# Patient Record
Sex: Female | Born: 1980 | Race: White | Hispanic: No | Marital: Single | Smoking: Current every day smoker
Health system: Southern US, Community
[De-identification: ages and names within clinical notes are randomized; demographics above are authoritative.]

## PROBLEM LIST (undated history)

## (undated) DIAGNOSIS — M5136 Other intervertebral disc degeneration, lumbar region: Secondary | ICD-10-CM

## (undated) DIAGNOSIS — G43909 Migraine, unspecified, not intractable, without status migrainosus: Secondary | ICD-10-CM

## (undated) DIAGNOSIS — M51369 Other intervertebral disc degeneration, lumbar region without mention of lumbar back pain or lower extremity pain: Secondary | ICD-10-CM

## (undated) DIAGNOSIS — M419 Scoliosis, unspecified: Secondary | ICD-10-CM

## (undated) DIAGNOSIS — D72829 Elevated white blood cell count, unspecified: Secondary | ICD-10-CM

## (undated) DIAGNOSIS — T7840XA Allergy, unspecified, initial encounter: Secondary | ICD-10-CM

## (undated) HISTORY — DX: Elevated white blood cell count, unspecified: D72.829

## (undated) HISTORY — DX: Allergy, unspecified, initial encounter: T78.40XA

## (undated) HISTORY — PX: INTRAUTERINE DEVICE (IUD) INSERTION: SHX5877

## (undated) HISTORY — DX: Migraine, unspecified, not intractable, without status migrainosus: G43.909

## (undated) HISTORY — PX: CHOLECYSTECTOMY: SHX55

---

## 2012-01-17 ENCOUNTER — Telehealth: Payer: Self-pay | Admitting: Hematology & Oncology

## 2012-01-17 NOTE — Telephone Encounter (Signed)
Pt aware of 02-20-12

## 2012-02-20 ENCOUNTER — Other Ambulatory Visit: Payer: Self-pay | Admitting: Lab

## 2012-02-20 ENCOUNTER — Ambulatory Visit: Payer: Self-pay | Admitting: Hematology & Oncology

## 2012-02-20 ENCOUNTER — Telehealth: Payer: Self-pay | Admitting: Hematology & Oncology

## 2012-02-20 ENCOUNTER — Ambulatory Visit: Payer: Self-pay

## 2012-02-20 NOTE — Telephone Encounter (Signed)
Pt cx 10-8 rescheduled for 10-30 her son is sick

## 2012-03-13 ENCOUNTER — Ambulatory Visit (HOSPITAL_BASED_OUTPATIENT_CLINIC_OR_DEPARTMENT_OTHER): Payer: Medicaid Other | Admitting: Hematology & Oncology

## 2012-03-13 ENCOUNTER — Ambulatory Visit: Payer: Medicaid Other

## 2012-03-13 ENCOUNTER — Other Ambulatory Visit: Payer: Medicaid Other | Admitting: Lab

## 2012-03-13 VITALS — BP 132/81 | HR 71 | Temp 97.9°F | Resp 18 | Ht 62.0 in | Wt 167.0 lb

## 2012-03-13 DIAGNOSIS — D72829 Elevated white blood cell count, unspecified: Secondary | ICD-10-CM

## 2012-03-13 DIAGNOSIS — M549 Dorsalgia, unspecified: Secondary | ICD-10-CM

## 2012-03-13 DIAGNOSIS — G8929 Other chronic pain: Secondary | ICD-10-CM

## 2012-03-13 LAB — CBC WITH DIFFERENTIAL (CANCER CENTER ONLY)
BASO#: 0 10*3/uL (ref 0.0–0.2)
BASO%: 0.3 % (ref 0.0–2.0)
EOS%: 2.7 % (ref 0.0–7.0)
HCT: 42.5 % (ref 34.8–46.6)
HGB: 15.1 g/dL (ref 11.6–15.9)
LYMPH#: 5.3 10*3/uL — ABNORMAL HIGH (ref 0.9–3.3)
MCHC: 35.5 g/dL (ref 32.0–36.0)
MONO#: 0.7 10*3/uL (ref 0.1–0.9)
NEUT#: 6.1 10*3/uL (ref 1.5–6.5)
NEUT%: 49.1 % (ref 39.6–80.0)
RDW: 12.4 % (ref 11.1–15.7)
WBC: 12.5 10*3/uL — ABNORMAL HIGH (ref 3.9–10.0)

## 2012-03-13 NOTE — Progress Notes (Signed)
This office note has been dictated.

## 2012-03-14 NOTE — Progress Notes (Signed)
CC:   Dana Jubilee, MD  DIAGNOSIS:  Leukocytosis, likely reactive.  HISTORY OF PRESENT ILLNESS:  Dana Mcdonald is a very nice 31 year old white female.  She is followed by Dr. Alberteen Sam.  Dr. Alberteen Sam has noted that she has had some leukocytosis.  Dana Mcdonald suffers from low back issues.  She has had bulging disks. She has some sciatica down the left leg.  She has had epidural injections.  She has not had any for about a year. She does not want surgery.  She does smoke.  She smokes probably about a half-a-pack per day.  Dr. Alberteen Sam did some lab work on her.  She was found to have a mild leukocytosis.  Back in August a CBC was done, which showed a white cell count of 13.9, hemoglobin 16.5, hematocrit 46.7, platelet count was 334,000.  She has had a normal white cell differential.  MCV was normal. She had normal electrolytes.  Dana Mcdonald states that her sister does have some blood issues. According to Dana Mcdonald, her sister also has leukocytosis and thrombocytosis.  Dana Mcdonald has not had any problem with infections.  There have been no rashes.  She has had no change in bowel or bladder habits.  There have been no joint issues, outside of that with her back.  She has had 2 kids.  She C-sections for each child.  Her back issues began, I think, with her first pregnancy.  She has had some hair thinning.  There has been no dysphasia or odynophagia.  She has had no swollen lymph glands.  Dr. Alberteen Sam was very kind and referred her to the [facility] for an evaluation.  PAST MEDICAL HISTORY: 1. 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 2. Cholecystectomy. 3. Migraines.  ALLERGIES:  None.  MEDICATIONS: 1. Norco (10/325) 1 p.o. q.6 hours p.r.n. 2. Vitamin D 1000 units p.o. daily.  SOCIAL HISTORY:  Remarkable for tobacco use.  She has about a 10-pack- year history of tobacco use.  There is occasional alcohol use.  There are no occupational  exposures.  FAMILY HISTORY:  Remarkable for, I think, ovarian cancer, diabetes, CVA.  REVIEW OF SYSTEMS:  As stated in History of Present Illness.  No additional findings noted on a 12-system review.  PHYSICAL EXAMINATION:  General:  This is a well-developed, well- nourished white female, in no obvious distress.  Vital signs: Temperature of 97.9, pulse 71, respiratory rate 18, blood pressure 132/81.  Weight is 167.  Head and neck:  Normocephalic, atraumatic skull.  There are no ocular or oral lesions.  There are no palpable cervical or supraclavicular lymph nodes.  She has no palpable thyroid. Lungs:  Clear bilaterally.  Cardiac:  Regular rate and rhythm, with a normal S1, S2.  There are no murmurs, rubs or bruits.  Abdomen:  Soft, with good bowel sounds.  There is no palpable abdominal mass.  There is no palpable hepatosplenomegaly.  Back:  No tenderness over the spine, ribs, or hips.  There may be some slight muscle spasms noted in the lumbosacral spine bilaterally.  Extremities:  Show no clubbing, cyanosis, or edema.  She has good range of motion of her joints.  Skin: Shows no rashes, ecchymoses, or petechiae.  Neurological:  Shows no focal neurological deficits.  LABORATORY STUDIES:  White cell count is 12.5, hemoglobin 15.1, hematocrit 42.5, platelet count 288,000.  MCV is 95.  White cell differential shows 50 segs, 43 lymphs, 5 monos, 3 eosinophils.  Peripheral smear shows  a normochromic, normocytic population of red blood cells.  There are no nucleated red blood cells.  I see no teardrop cells.  There is no rouleaux formation.  There are no target cells.  I see no schistocytes or spherocytes.  White cells appear minimally increased in number.  There are no hypersegmented polys.  I see no immature myeloid cells.  There are no atypical lymphocytes.  There are no blasts.  Platelets are adequate in number and size.  IMPRESSION:  Dana Mcdonald is a very nice 31 year old white  female.  She has mild leukocytosis.  I have to believe that this is reactive.  I did send off a leukocyte alkaline phosphatase on her blood work.  I do not see any evidence of a myeloproliferative neoplasm.  As such, I do not see that we needed to send off a BCR/ABL or JAK2 assay.  I do not see a need for a bone marrow biopsy on Dana Mcdonald.  I do not find anything on her physical exam that would suggest an underlying neoplasm or malignancy.  I do not think she needs a chest x-ray.  She has no pulmonary symptoms. She does smoke.  I suppose that smoking might be related to her leukocytosis.  However, typically leukocytosis related to smoking shows slight lymphocytosis.  I suspect that the chronic back issues that she has might be also related to the elevated white cells.  Again, she is under constant stress.  The constant inflammation from her back could certainly be triggering her immune system a little bit.  From my point of view, I think we can just follow her up in about 4 months' time.  I do not think we need any blood work in between visits.  I spent a good hour with Dana Mcdonald.  She is very nice.  I showed her the lab work.  I reviewed my recommendations for her.  She has good understanding.  She, herself, is not too worried, although with a history of cancer in the family and with her sister having some blood problems, she was reassured after seeing [place].  I did give her a prayer blanket.  She was very appreciative of this.    ______________________________ Josph Macho, M.D. PRE/MEDQ  D:  03/13/2012  T:  03/14/2012  Job:  [id number]

## 2012-03-22 LAB — LEUKOCYTE ALKALINE PHOSPHATASE: Leukocyte Alkaline Phos Stain: 190

## 2012-03-22 LAB — LACTATE DEHYDROGENASE: LDH: 193 U/L (ref 94–250)

## 2012-07-02 ENCOUNTER — Telehealth: Payer: Self-pay | Admitting: Hematology & Oncology

## 2012-07-02 NOTE — Telephone Encounter (Signed)
Informed patient of appointment change to 08/09/12 at 10:30am.  Patient confirmed this time will be ok.

## 2012-07-18 ENCOUNTER — Other Ambulatory Visit: Payer: Medicaid Other | Admitting: Lab

## 2012-07-18 ENCOUNTER — Ambulatory Visit: Payer: Medicaid Other | Admitting: Hematology & Oncology

## 2012-08-09 ENCOUNTER — Ambulatory Visit: Payer: Medicaid Other | Admitting: Hematology & Oncology

## 2012-08-09 ENCOUNTER — Other Ambulatory Visit: Payer: Medicaid Other | Admitting: Lab

## 2012-08-09 ENCOUNTER — Ambulatory Visit (HOSPITAL_BASED_OUTPATIENT_CLINIC_OR_DEPARTMENT_OTHER): Payer: Medicaid Other | Admitting: Lab

## 2012-08-09 ENCOUNTER — Ambulatory Visit (HOSPITAL_BASED_OUTPATIENT_CLINIC_OR_DEPARTMENT_OTHER): Payer: Medicaid Other | Admitting: Medical

## 2012-08-09 VITALS — BP 126/80 | HR 76 | Temp 98.0°F | Resp 18 | Ht 62.0 in | Wt 167.0 lb

## 2012-08-09 DIAGNOSIS — D72829 Elevated white blood cell count, unspecified: Secondary | ICD-10-CM

## 2012-08-09 LAB — CBC WITH DIFFERENTIAL (CANCER CENTER ONLY)
EOS%: 1.5 % (ref 0.0–7.0)
MCH: 32.7 pg (ref 26.0–34.0)
MCHC: 34.8 g/dL (ref 32.0–36.0)
MONO%: 5 % (ref 0.0–13.0)
NEUT#: 9.2 10*3/uL — ABNORMAL HIGH (ref 1.5–6.5)
Platelets: 305 10*3/uL (ref 145–400)
RBC: 4.71 10*6/uL (ref 3.70–5.32)

## 2012-08-09 LAB — CHCC SATELLITE - SMEAR

## 2012-08-09 NOTE — Progress Notes (Signed)
DIAGNOSIS:  Leukocytosis, likely reactive.  Current therapy: Observation  Interim history: Dana Mcdonald presents today for an office followup visit.  Overall, she, reports, that she's doing quite well.  We continue to check.  Her white count secondary to her leukocytosis.  Her white count today is 14.  She does continue to smoke.  She also reports, that she gets epidural steroid injections intermittently.  She's not reported any recent, chronic or recurrent infections.  She's not reporting any rashes.  She does have major problems with her lower back.  She's not reporting any fevers, or night sweats, or unintentional weight, loss.  She's not reporting any swollen glands.  Her leukocytosis could be reactive.  She, reports, a good appetite.  She denies any nausea, vomiting, diarrhea, constipation, chest pain, shortness of breath, or cough.  She denies any fevers, chills, or night sweats.  She denies any type of abdominal pain, any obvious, or abnormal bleeding.  She denies any lower leg swelling.  She denies any, visual changes, or rashes.  She does have intermittent headaches.  Overall, she remains asymptomatic.   Review of Systems: Constitutional:Negative for malaise/fatigue, fever, chills, weight loss, diaphoresis, activity change, appetite change, and unexpected weight change.  HEENT: Negative for double vision, blurred vision, visual loss, ear pain, tinnitus, congestion, rhinorrhea, epistaxis sore throat or sinus disease, oral pain/lesion, tongue soreness Respiratory: Negative for cough, chest tightness, shortness of breath, wheezing and stridor.  Cardiovascular: Negative for chest pain, palpitations, leg swelling, orthopnea, PND, DOE or claudication Gastrointestinal: Negative for nausea, vomiting, abdominal pain, diarrhea, constipation, blood in stool, melena, hematochezia, abdominal distention, anal bleeding, rectal pain, anorexia and hematemesis.  Genitourinary: Negative for dysuria, frequency,  hematuria,  Musculoskeletal: Negative for myalgias, back pain, joint swelling, arthralgias and gait problem.  Skin: Negative for rash, color change, pallor and wound.  Neurological:. Negative for dizziness/light-headedness, tremors, seizures, syncope, facial asymmetry, speech difficulty, weakness, numbness, headaches and paresthesias.  Hematological: Negative for adenopathy. Does not bruise/bleed easily.  Psychiatric/Behavioral:  Negative for depression, no loss of interest in normal activity or change in sleep pattern.   Physical Exam: This is a pleasant, 32 year old, well-developed, well-nourished, white female, in no obvious distress Vitals:, temperature 98.0 degrees, pulse 76, respirations 18, blood pressure 126/80.  Weight 167 pounds  HEENT reveals a normocephalic, atraumatic skull, no scleral icterus, no oral lesions  Neck is supple without any cervical or supraclavicular adenopathy.  Lungs are clear to auscultation bilaterally. There are no wheezes, rales or rhonci Cardiac is regular rate and rhythm with a normal S1 and S2. There are no murmurs, rubs, or bruits.  Abdomen is soft with good bowel sounds, there is no palpable mass. There is no palpable hepatosplenomegaly. There is no palpable fluid wave.  Musculoskeletal no tenderness of the spine, ribs, or hips.  Extremities there are no clubbing, cyanosis, or edema.  Skin no petechia, purpura or ecchymosis Neurologic is nonfocal.  Laboratory Data: White count 14.0, hemoglobin 15.4, hematocrit 44.2, platelets 305,000  Current Outpatient Prescriptions on File Prior to Visit  Medication Sig Dispense Refill  . HYDROcodone-acetaminophen (NORCO) 10-325 MG per tablet Take 1 tablet by mouth every 6 (six) hours as needed.      . Multiple Vitamins-Minerals (WOMENS 50+ MULTI VITAMIN/MIN) TABS Take by mouth every morning.      . Vit B6-Vit B12-Omega 3 Acids (VITAMIN B PLUS+ PO) Take by mouth every morning.      Marland Kitchen VITAMIN D, CHOLECALCIFEROL, PO  Take 1,000 Units by mouth every morning.  No current facility-administered medications on file prior to visit.   Assessment/Plan: This is a 32 year old white female with the following issues:  #1.  Mild leukocytosis.  This is most likely reactive..  There is no indication for any type of bone marrow, biopsy, at this time.  She does smoke.  She also has chronic back issues, which produce, inflammation.  We will continue to follow her about every 4 months.  #2.  Followup.  We will follow back up with Dana Mcdonald in 4 months, but before then should there be questions or concerns.

## 2012-08-29 ENCOUNTER — Ambulatory Visit (INDEPENDENT_AMBULATORY_CARE_PROVIDER_SITE_OTHER): Payer: Medicaid Other | Admitting: Family Medicine

## 2012-08-29 ENCOUNTER — Encounter: Payer: Self-pay | Admitting: Family Medicine

## 2012-08-29 VITALS — BP 124/88 | HR 83 | Wt 166.0 lb

## 2012-08-29 DIAGNOSIS — J069 Acute upper respiratory infection, unspecified: Secondary | ICD-10-CM

## 2012-08-29 MED ORDER — AMOXICILLIN 875 MG PO TABS: 875.0000 mg | ORAL_TABLET | Freq: Two times a day (BID) | ORAL | Status: DC

## 2012-08-29 NOTE — Patient Instructions (Addendum)
1)  URI   A)  COLD EEZE & UMCKA COLD CARE  B)  Head Congestion - Mucinex D 1200/120 -  2 times per day, Zyrtec and Steroid Nasal Spray (Nasonex vs Flonase)   C)  Cough - Delysm 2 teaspoons 2 times per day and Tessalon Perles   D) Take Amoxil 2 times per day for 10 days    Allergies, Generic Allergies may happen from anything your body is sensitive to. This may be food, medicines, pollens, chemicals, and nearly anything around you in everyday life that produces allergens. An allergen is anything that causes an allergy producing substance. Heredity is often a factor in causing these problems. This means you may have some of the same allergies as your parents. Food allergies happen in all age groups. Food allergies are some of the most severe and life threatening. Some common food allergies are cow's milk, seafood, eggs, nuts, wheat, and soybeans. SYMPTOMS   Swelling around the mouth.  An itchy red rash or hives.  Vomiting or diarrhea.  Difficulty breathing. SEVERE ALLERGIC REACTIONS ARE LIFE-THREATENING. This reaction is called anaphylaxis. It can cause the mouth and throat to swell and cause difficulty with breathing and swallowing. In severe reactions only a trace amount of food (for example, peanut oil in a salad) may cause death within seconds. Seasonal allergies occur in all age groups. These are seasonal because they usually occur during the same season every year. They may be a reaction to molds, grass pollens, or tree pollens. Other causes of problems are house dust mite allergens, pet dander, and mold spores. The symptoms often consist of nasal congestion, a runny itchy nose associated with sneezing, and tearing itchy eyes. There is often an associated itching of the mouth and ears. The problems happen when you come in contact with pollens and other allergens. Allergens are the particles in the air that the body reacts to with an allergic reaction. This causes you to release allergic  antibodies. Through a chain of events, these eventually cause you to release histamine into the blood stream. Although it is meant to be protective to the body, it is this release that causes your discomfort. This is why you were given anti-histamines to feel better. If you are unable to pinpoint the offending allergen, it may be determined by skin or blood testing. Allergies cannot be cured but can be controlled with medicine. Hay fever is a collection of all or some of the seasonal allergy problems. It may often be treated with simple over-the-counter medicine such as diphenhydramine. Take medicine as directed. Do not drink alcohol or drive while taking this medicine. Check with your caregiver or package insert for child dosages. If these medicines are not effective, there are many new medicines your caregiver can prescribe. Stronger medicine such as nasal spray, eye drops, and corticosteroids may be used if the first things you try do not work well. Other treatments such as immunotherapy or desensitizing injections can be used if all else fails. Follow up with your caregiver if problems continue. These seasonal allergies are usually not life threatening. They are generally more of a nuisance that can often be handled using medicine. HOME CARE INSTRUCTIONS   If unsure what causes a reaction, keep a diary of foods eaten and symptoms that follow. Avoid foods that cause reactions.  If hives or rash are present:  Take medicine as directed.  You may use an over-the-counter antihistamine (diphenhydramine) for hives and itching as needed.  Apply cold  compresses (cloths) to the skin or take baths in cool water. Avoid hot baths or showers. Heat will make a rash and itching worse.  If you are severely allergic:  Following a treatment for a severe reaction, hospitalization is often required for closer follow-up.  Wear a medic-alert bracelet or necklace stating the allergy.  You and your family must  learn how to give adrenaline or use an anaphylaxis kit.  If you have had a severe reaction, always carry your anaphylaxis kit or EpiPen with you. Use this medicine as directed by your caregiver if a severe reaction is occurring. Failure to do so could have a fatal outcome. SEEK MEDICAL CARE IF:  You suspect a food allergy. Symptoms generally happen within 30 minutes of eating a food.  Your symptoms have not gone away within 2 days or are getting worse.  You develop new symptoms.  You want to retest yourself or your child with a food or drink you think causes an allergic reaction. Never do this if an anaphylactic reaction to that food or drink has happened before. Only do this under the care of a caregiver. SEEK IMMEDIATE MEDICAL CARE IF:   You have difficulty breathing, are wheezing, or have a tight feeling in your chest or throat.  You have a swollen mouth, or you have hives, swelling, or itching all over your body.  You have had a severe reaction that has responded to your anaphylaxis kit or an EpiPen. These reactions may return when the medicine has worn off. These reactions should be considered life threatening. MAKE SURE YOU:   Understand these instructions.  Will watch your condition.  Will get help right away if you are not doing well or get worse. Document Released: 07/25/2002 Document Revised: 07/24/2011 Document Reviewed: 12/30/2007 [facility] Patient Information 2013 [place], [place].

## 2012-08-29 NOTE — Progress Notes (Signed)
Subjective:     Patient ID: Dana Mcdonald, female   DOB: 1981-02-18, 32 y.o.   MRN: [id number]  HPI Dana Mcdonald is here today complaining of URI symptoms including facial pressure and right ear pain.  Dana Mcdonald has been feeling ill for the past several days.  Dana Mcdonald describes her symptoms as being moderate in nature and Dana Mcdonald feels that Dana Mcdonald is getting worse.  Dana Mcdonald is amazed at the amount of congestion Dana Mcdonald is blowing out of her head.  Dana Mcdonald has a Lloyd Huger Med Sinus rinse at home but has not used it.  Dana Mcdonald has been taking Claritin which has not helped her very much.    Review of Systems  Constitutional: Negative for fever and chills.  HENT: Positive for ear pain, congestion, dental problem, postnasal drip and sinus pressure. Negative for hearing loss.   Eyes: Positive for redness.  Respiratory: Positive for cough.       Past Medical History  Diagnosis Date  . Migraine   . Allergy     Family History  Problem Relation Age of Onset  . Heart disease Father   . Diabetes Father   . Diabetes Sister   . Hypertension Sister   . Heart disease Maternal Grandfather    History   Social History  . Marital Status: Unknown    Spouse Name: N/A    Number of Children: N/A  . Years of Education: N/A   Occupational History  . Not on file.   Social History Main Topics  . Smoking status: Current Every Day Smoker -- 0.50 packs/day    Start date: 05/16/1999  . Smokeless tobacco: Not on file  . Alcohol Use: Not on file  . Drug Use: Not on file  . Sexually Active: Yes   Other Topics Concern  . Not on file   Social History Narrative   Marital Status: Single ([place])    Children:  Sons [name], [place])    Pets: None   Living Situation: Lives alone with her mother   Occupation: Unemployed   Education:  Engineer, agricultural    Tobacco Use/Exposure:  Yes - Dana Mcdonald is smoking 5-6 cigs per day.  Dana Mcdonald has smoked about 10 years.     Alcohol Use:  Occasional wine, beer   Drug Use:  None   Diet:  Regular   Exercise:   Walking, Softball, Volleyball    Hobbies:  Reading    Objective:   Physical Exam  Constitutional: Dana Mcdonald appears well-nourished. No distress.  HENT:  Head: Normocephalic.  Right Ear: There is tenderness. Tympanic membrane is injected.  Left Ear: Hearing normal. A middle ear effusion is present.  Eyes: Right eye exhibits no discharge. Left eye exhibits no discharge. Right conjunctiva is injected. Left conjunctiva is injected.  Neck: Neck supple. No thyromegaly present.  Cardiovascular: Normal rate, regular rhythm and normal heart sounds.   Pulmonary/Chest: Effort normal and breath sounds normal.  Lymphadenopathy:    Dana Mcdonald has no cervical adenopathy.  Neurological: Dana Mcdonald is alert.  Skin: Skin is warm and dry.  Psychiatric: Dana Mcdonald has a normal mood and affect.       Assessment:     Upper Respiratory Infection Otalgia     Plan:    1)  Dana Mcdonald was reminded to ALWAYS start her treatment with Cold Eeze and Umcka Cold Care to minimize the severity and duration of her symptoms.    2)  Dana Mcdonald will take Mucinex D (1200/120) and Zyrtec for her head congestion and was instructed how to  use a Nasonex Nasal Spray. Dana Mcdonald was provided with a sample of Nasonex but was given a prescription for fluticasone since this is probably all that Medicaid will cover. Dana Mcdonald will take Delsym and Tessalon Perles for her cough.    3)  Since her right ear is so inflamed and Dana Mcdonald is in a lot of pain, I decided to treat her with Amoxil.  Hopefully, this will keep her ear looking like it did the last time Dana Mcdonald had an ear infection back in 08/2010.

## 2012-08-30 ENCOUNTER — Encounter: Payer: Self-pay | Admitting: Family Medicine

## 2012-08-31 ENCOUNTER — Encounter: Payer: Self-pay | Admitting: Family Medicine

## 2012-08-31 DIAGNOSIS — J069 Acute upper respiratory infection, unspecified: Secondary | ICD-10-CM | POA: Insufficient documentation

## 2012-09-16 ENCOUNTER — Telehealth: Payer: Self-pay | Admitting: *Deleted

## 2012-09-16 NOTE — Telephone Encounter (Signed)
[facility] CALLED AND SAID THAT THEY NEED A REFERRAL TO SEE PT.  PT IS HAVING ISSUES WITH IUD AND WANTS A YEARLY PAP/PHSY.  DR. Weyman Croon BO PLEASE CALL JAMIE (RECEPT AT [facility]) [phone]

## 2012-12-05 ENCOUNTER — Telehealth: Payer: Self-pay | Admitting: Hematology & Oncology

## 2012-12-05 NOTE — Telephone Encounter (Signed)
Pt moved 7-25 to 8-5 her son is sick

## 2012-12-06 ENCOUNTER — Other Ambulatory Visit: Payer: Medicaid Other | Admitting: Lab

## 2012-12-06 ENCOUNTER — Ambulatory Visit: Payer: Medicaid Other | Admitting: Hematology & Oncology

## 2012-12-17 ENCOUNTER — Ambulatory Visit: Payer: Medicaid Other | Admitting: Hematology & Oncology

## 2012-12-17 ENCOUNTER — Other Ambulatory Visit: Payer: Medicaid Other | Admitting: Lab

## 2012-12-17 ENCOUNTER — Telehealth: Payer: Self-pay | Admitting: Hematology & Oncology

## 2012-12-17 NOTE — Telephone Encounter (Signed)
Pt called said she is sick and will call back to reschedule

## 2013-04-16 ENCOUNTER — Encounter: Payer: Self-pay | Admitting: Family Medicine

## 2013-04-16 ENCOUNTER — Ambulatory Visit (INDEPENDENT_AMBULATORY_CARE_PROVIDER_SITE_OTHER): Payer: Medicaid Other | Admitting: Family Medicine

## 2013-04-16 VITALS — BP 122/85 | HR 71 | Resp 16 | Ht 62.5 in | Wt 158.0 lb

## 2013-04-16 DIAGNOSIS — F411 Generalized anxiety disorder: Secondary | ICD-10-CM

## 2013-04-16 DIAGNOSIS — F329 Major depressive disorder, single episode, unspecified: Secondary | ICD-10-CM

## 2013-04-16 DIAGNOSIS — F3289 Other specified depressive episodes: Secondary | ICD-10-CM

## 2013-04-16 DIAGNOSIS — Z720 Tobacco use: Secondary | ICD-10-CM | POA: Insufficient documentation

## 2013-04-16 DIAGNOSIS — F17200 Nicotine dependence, unspecified, uncomplicated: Secondary | ICD-10-CM

## 2013-04-16 MED ORDER — CITALOPRAM HYDROBROMIDE 20 MG PO TABS: 20.0000 mg | ORAL_TABLET | Freq: Every day | ORAL | Status: DC

## 2013-04-16 MED ORDER — BUPROPION HCL ER (XL) 150 MG PO TB24: 150.0000 mg | ORAL_TABLET | ORAL | Status: DC

## 2013-04-16 NOTE — Patient Instructions (Signed)
1)  Mood - Start with 1/2 citalopram at bedtime for 1 week then increase to 1 tab and add the Wellbutrin 150 in the am.     Smoking Cessation Quitting smoking is important to your health and has many advantages. However, it is not always easy to quit since nicotine is a very addictive drug. Often times, people try 3 times or more before being able to quit. This document explains the best ways for you to prepare to quit smoking. Quitting takes hard work and a lot of effort, but you can do it. ADVANTAGES OF QUITTING SMOKING  You will live longer, feel better, and live better.  Your body will feel the impact of quitting smoking almost immediately.  Within 20 minutes, blood pressure decreases. Your pulse returns to its normal level.  After 8 hours, carbon monoxide levels in the blood return to normal. Your oxygen level increases.  After 24 hours, the chance of having a heart attack starts to decrease. Your breath, hair, and body stop smelling like smoke.  After 48 hours, damaged nerve endings begin to recover. Your sense of taste and smell improve.  After 72 hours, the body is virtually free of nicotine. Your bronchial tubes relax and breathing becomes easier.  After 2 to 12 weeks, lungs can hold more air. Exercise becomes easier and circulation improves.  The risk of having a heart attack, stroke, cancer, or lung disease is greatly reduced.  After 1 year, the risk of coronary heart disease is cut in half.  After 5 years, the risk of stroke falls to the same as a nonsmoker.  After 10 years, the risk of lung cancer is cut in half and the risk of other cancers decreases significantly.  After 15 years, the risk of coronary heart disease drops, usually to the level of a nonsmoker.  If you are pregnant, quitting smoking will improve your chances of having a healthy baby.  The people you live with, especially any children, will be healthier.  You will have extra money to spend on things  other than cigarettes. QUESTIONS TO THINK ABOUT BEFORE ATTEMPTING TO QUIT You may want to talk about your answers with your caregiver.  Why do you want to quit?  If you tried to quit in the past, what helped and what did not?  What will be the most difficult situations for you after you quit? How will you plan to handle them?  Who can help you through the tough times? Your family? Friends? A caregiver?  What pleasures do you get from smoking? What ways can you still get pleasure if you quit? Here are some questions to ask your caregiver:  How can you help me to be successful at quitting?  What medicine do you think would be best for me and how should I take it?  What should I do if I need more help?  What is smoking withdrawal like? How can I get information on withdrawal? GET READY  Set a quit date.  Change your environment by getting rid of all cigarettes, ashtrays, matches, and lighters in your home, car, or work. Do not let people smoke in your home.  Review your past attempts to quit. Think about what worked and what did not. GET SUPPORT AND ENCOURAGEMENT You have a better chance of being successful if you have help. You can get support in many ways.  Tell your family, friends, and co-workers that you are going to quit and need their support. Ask  them not to smoke around you.  Get individual, group, or telephone counseling and support. Programs are available at [employer] and health centers. Call your local health department for information about programs in your area.  Spiritual beliefs and practices may help some smokers quit.  Download a "quit meter" on your computer to keep track of quit statistics, such as how long you have gone without smoking, cigarettes not smoked, and money saved.  Get a self-help book about quitting smoking and staying off of tobacco. LEARN NEW SKILLS AND BEHAVIORS  Distract yourself from urges to smoke. Talk to someone, go for a walk,  or occupy your time with a task.  Change your normal routine. Take a different route to work. Drink tea instead of coffee. Eat breakfast in a different place.  Reduce your stress. Take a hot bath, exercise, or read a book.  Plan something enjoyable to do every day. Reward yourself for not smoking.  Explore interactive web-based programs that specialize in helping you quit. GET MEDICINE AND USE IT CORRECTLY Medicines can help you stop smoking and decrease the urge to smoke. Combining medicine with the above behavioral methods and support can greatly increase your chances of successfully quitting smoking.  Nicotine replacement therapy helps deliver nicotine to your body without the negative effects and risks of smoking. Nicotine replacement therapy includes nicotine gum, lozenges, inhalers, nasal sprays, and skin patches. Some may be available over-the-counter and others require a prescription.  Antidepressant medicine helps people abstain from smoking, but how this works is unknown. This medicine is available by prescription.  Nicotinic receptor partial agonist medicine simulates the effect of nicotine in your brain. This medicine is available by prescription. Ask your caregiver for advice about which medicines to use and how to use them based on your health history. Your caregiver will tell you what side effects to look out for if you choose to be on a medicine or therapy. Carefully read the information on the package. Do not use any other product containing nicotine while using a nicotine replacement product.  RELAPSE OR DIFFICULT SITUATIONS Most relapses occur within the first 3 months after quitting. Do not be discouraged if you start smoking again. Remember, most people try several times before finally quitting. You may have symptoms of withdrawal because your body is used to nicotine. You may crave cigarettes, be irritable, feel very hungry, cough often, get headaches, or have difficulty  concentrating. The withdrawal symptoms are only temporary. They are strongest when you first quit, but they will go away within 10 14 days. To reduce the chances of relapse, try to:  Avoid drinking alcohol. Drinking lowers your chances of successfully quitting.  Reduce the amount of caffeine you consume. Once you quit smoking, the amount of caffeine in your body increases and can give you symptoms, such as a rapid heartbeat, sweating, and anxiety.  Avoid smokers because they can make you want to smoke.  Do not let weight gain distract you. Many smokers will gain weight when they quit, usually less than 10 pounds. Eat a healthy diet and stay active. You can always lose the weight gained after you quit.  Find ways to improve your mood other than smoking. FOR MORE INFORMATION  www.smokefree.gov  Document Released: 04/25/2001 Document Revised: 10/31/2011 Document Reviewed: 08/10/2011 [facility] Patient Information 2014 [place], [place].

## 2013-04-16 NOTE — Progress Notes (Signed)
Subjective:    Patient ID: Dana Mcdonald, female    DOB: 12/15/1980, 32 y.o.   MRN: [id number]  HPI  Dana Mcdonald is here today to discuss her mood which she feels is unstable.  She is currently trying to quit smoking by reducing the number of cigarettes she smokes daily.  She continues to crave nicotine and says that she feels anxious, sad and irritable at times.  She wonders if this is a side effect of nicotine withdrawal.     Review of Systems  Constitutional: Negative for fatigue and unexpected weight change.  Respiratory: Negative for chest tightness and shortness of breath.   Cardiovascular: Negative for chest pain and palpitations.  Psychiatric/Behavioral: Positive for sleep disturbance. The patient is nervous/anxious.        Sad and irritable  All other systems reviewed and are negative.     Past Medical History  Diagnosis Date  . Migraine   . Allergy   . Leukocytosis      Past Surgical History  Procedure Laterality Date  . Cholecystectomy    . Intrauterine device (iud) insertion       History   Social History Narrative   Marital Status: Single (Dana Mcdonald)    Children:  Sons Dana Mcdonald, Dana Mcdonald)    Pets: None   Living Situation: Lives alone with her mother   Occupation: Unemployed   Education:  Engineer, agricultural    Tobacco Use/Exposure:  Yes - She is smoking 5-6 cigs per day.  She has smoked about 10 years.     Alcohol Use:  Occasional wine, beer   Drug Use:  None   Diet:  Regular   Exercise:  Walking, Softball, Volleyball    Hobbies:  Reading     Family History  Problem Relation Age of Onset  . Heart disease Father   . Diabetes Father   . Diabetes Sister   . Hypertension Sister   . Heart disease Sister   . Heart disease Maternal Grandfather      Current Outpatient Prescriptions on File Prior to Visit  Medication Sig Dispense Refill  . EPINEPHrine (EPIPEN JR) 0.15 MG/0.3ML injection Inject 0.15 mg into the muscle as needed for anaphylaxis.       Marland Kitchen HYDROcodone-acetaminophen (NORCO) 10-325 MG per tablet Take 1 tablet by mouth every 6 (six) hours as needed.      . Vit B6-Vit B12-Omega 3 Acids (VITAMIN B PLUS+ PO) Take by mouth every morning.      . Multiple Vitamins-Minerals (WOMENS 50+ MULTI VITAMIN/MIN) TABS Take by mouth every morning.       No current facility-administered medications on file prior to visit.     Allergies  Allergen Reactions  . Bee Venom Hives      Objective:   Physical Exam  Vitals reviewed. Constitutional: She is oriented to person, place, and time.  Eyes: Conjunctivae are normal. No scleral icterus.  Neck: Neck supple. No thyromegaly present.  Cardiovascular: Normal rate, regular rhythm and normal heart sounds.   Pulmonary/Chest: Effort normal and breath sounds normal.  Musculoskeletal: She exhibits no edema and no tenderness.  Lymphadenopathy:    She has no cervical adenopathy.  Neurological: She is alert and oriented to person, place, and time.  Skin: Skin is warm and dry.  Psychiatric: She has a normal mood and affect. Her behavior is normal. Judgment and thought content normal.      Assessment & Plan:   Dana Mcdonald was seen today for mood.  Diagnoses and associated  orders for this visit:  Depressive disorder, not elsewhere classified - buPROPion (WELLBUTRIN XL) 150 MG 24 hr tablet; Take 1 tablet (150 mg total) by mouth every morning.  Anxiety state, unspecified - citalopram (CELEXA) 20 MG tablet; Take 1 tablet (20 mg total) by mouth at bedtime.  Smoking trying to quit

## 2013-05-28 ENCOUNTER — Ambulatory Visit: Payer: Medicaid Other | Admitting: Family Medicine

## 2013-06-16 ENCOUNTER — Encounter (INDEPENDENT_AMBULATORY_CARE_PROVIDER_SITE_OTHER): Payer: Self-pay

## 2013-06-16 ENCOUNTER — Encounter: Payer: Self-pay | Admitting: Family Medicine

## 2013-06-16 ENCOUNTER — Ambulatory Visit (INDEPENDENT_AMBULATORY_CARE_PROVIDER_SITE_OTHER): Payer: Medicaid Other | Admitting: Family Medicine

## 2013-06-16 VITALS — BP 127/86 | HR 80 | Ht 62.5 in | Wt 165.0 lb

## 2013-06-16 DIAGNOSIS — Z20828 Contact with and (suspected) exposure to other viral communicable diseases: Secondary | ICD-10-CM

## 2013-06-16 DIAGNOSIS — F411 Generalized anxiety disorder: Secondary | ICD-10-CM

## 2013-06-16 DIAGNOSIS — R41840 Attention and concentration deficit: Secondary | ICD-10-CM

## 2013-06-16 MED ORDER — ESCITALOPRAM OXALATE 5 MG PO TABS: 5.0000 mg | ORAL_TABLET | Freq: Every day | ORAL | Status: DC

## 2013-06-16 MED ORDER — LISDEXAMFETAMINE DIMESYLATE 40 MG PO CAPS: 40.0000 mg | ORAL_CAPSULE | ORAL | Status: DC

## 2013-06-16 NOTE — Progress Notes (Signed)
   Subjective:    Patient ID: Dana Mcdonald, female    DOB: Sep 17, 1980, 33 y.o.   MRN: [id number]  HPI  Kiarra is here today with her boyfriend ([place]had) to discuss her anxiety/smoking cessation.  She is not currently taking any medications for anxiety and is back to smoking 0.5 ppd. She feels very stressed out and she thinks that she has a lot of trouble concentrating.      Review of Systems  Psychiatric/Behavioral: The patient is nervous/anxious.      Past Medical History  Diagnosis Date  . Migraine   . Allergy   . Leukocytosis      Past Surgical History  Procedure Laterality Date  . Cholecystectomy    . Intrauterine device (iud) insertion       History   Social History Narrative   Marital Status: Single (Italyhad)    Children:  Sons [name]([place], [place])    Pets: None   Living Situation: Lives alone with her mother   Occupation: Unemployed   Education:  Engineer, agriculturalHigh School Graduate    Tobacco Use/Exposure:  Yes - She is smoking 5-6 cigs per day.  She has smoked about 10 years.     Alcohol Use:  Occasional wine, beer   Drug Use:  None   Diet:  Regular   Exercise:  Walking, Softball, Volleyball    Hobbies:  Reading     Family History  Problem Relation Age of Onset  . Heart disease Father   . Diabetes Father   . Diabetes Sister   . Hypertension Sister   . Heart disease Sister   . Heart disease Maternal Grandfather      Current Outpatient Prescriptions on File Prior to Visit  Medication Sig Dispense Refill  . EPINEPHrine (EPIPEN JR) 0.15 MG/0.3ML injection Inject 0.15 mg into the muscle as needed for anaphylaxis.      Marland Kitchen. HYDROcodone-acetaminophen (NORCO) 10-325 MG per tablet Take 1 tablet by mouth every 6 (six) hours as needed.      . Multiple Vitamins-Minerals (WOMENS 50+ MULTI VITAMIN/MIN) TABS Take by mouth every morning.      . Vit B6-Vit B12-Omega 3 Acids (VITAMIN B PLUS+ PO) Take by mouth every morning.       No current facility-administered medications on file  prior to visit.     Allergies  Allergen Reactions  . Bee Venom Hives       Objective:   Physical Exam  Constitutional: She appears well-nourished. No distress.  Cardiovascular: Normal rate, regular rhythm and normal heart sounds.   Pulmonary/Chest: Effort normal and breath sounds normal.  Neurological: She is alert.  Psychiatric: Her speech is normal and behavior is normal. Judgment and thought content normal. Her mood appears anxious. Her affect is not inappropriate. Cognition and memory are normal.      Assessment & Plan:    Shareta was seen today for anxiety.  Diagnoses and associated orders for this visit:  Anxiety state, unspecified - escitalopram (LEXAPRO) 5 MG tablet; Take 1 tablet (5 mg total) by mouth daily.  Concentration deficit - lisdexamfetamine (VYVANSE) 40 MG capsule; Take 1 capsule (40 mg total) by mouth every morning.  Exposure to influenza Comments: [place] tested positive for influenza so we'll treat [place] prophylactically.   - oseltamivir (TAMIFLU) 75 MG capsule; Take 1 capsule (75 mg total) by mouth daily.

## 2013-06-24 MED ORDER — OSELTAMIVIR PHOSPHATE 75 MG PO CAPS: 75.0000 mg | ORAL_CAPSULE | Freq: Every day | ORAL | Status: DC

## 2013-07-06 DIAGNOSIS — F3289 Other specified depressive episodes: Secondary | ICD-10-CM | POA: Insufficient documentation

## 2013-07-06 DIAGNOSIS — F329 Major depressive disorder, single episode, unspecified: Secondary | ICD-10-CM | POA: Insufficient documentation

## 2013-07-06 DIAGNOSIS — F411 Generalized anxiety disorder: Secondary | ICD-10-CM | POA: Insufficient documentation

## 2013-07-09 ENCOUNTER — Encounter: Payer: Self-pay | Admitting: Family Medicine

## 2013-07-09 ENCOUNTER — Ambulatory Visit (INDEPENDENT_AMBULATORY_CARE_PROVIDER_SITE_OTHER): Payer: Medicaid Other | Admitting: Family Medicine

## 2013-07-09 VITALS — BP 116/83 | HR 82 | Resp 16 | Wt 163.0 lb

## 2013-07-09 DIAGNOSIS — F411 Generalized anxiety disorder: Secondary | ICD-10-CM

## 2013-07-09 DIAGNOSIS — R41840 Attention and concentration deficit: Secondary | ICD-10-CM

## 2013-07-09 MED ORDER — LISDEXAMFETAMINE DIMESYLATE 50 MG PO CAPS: 50.0000 mg | ORAL_CAPSULE | ORAL | Status: DC

## 2013-07-09 MED ORDER — LISDEXAMFETAMINE DIMESYLATE 50 MG PO CAPS: 50.0000 mg | ORAL_CAPSULE | ORAL | Status: AC

## 2013-07-09 NOTE — Progress Notes (Signed)
Subjective:    Patient ID: Dana Mcdonald, female    DOB: 05-31-1980, 33 y.o.   MRN: [id number]  HPI  Dana Mcdonald is here today to follow up on her medications. She is taking the Vyvanse daily.  She feels that it has helped her but she does not feel that it lasts long enough.  She is not taking the Lexapro. She was very nervious about mixing the Lexapro with her other medications.  She is still trying to stop smoking. She feels that she is smoking less. She does still seem very anxious.    Review of Systems  Constitutional: Negative for activity change, appetite change, fatigue and unexpected weight change.  Psychiatric/Behavioral: Negative for sleep disturbance and decreased concentration. The patient is nervous/anxious.        The first three nights on Vyvanse she had problems sleeping. This has resolved.  All other systems reviewed and are negative.    Past Medical History  Diagnosis Date  . Migraine   . Allergy   . Leukocytosis      Past Surgical History  Procedure Laterality Date  . Cholecystectomy    . Intrauterine device (iud) insertion       History   Social History Narrative   Marital Status: Single ([place]had)    Children:  Sons [name]([place], [place])    Pets: None   Living Situation: Lives alone with her mother   Occupation: Unemployed   Education:  Engineer, agriculturalHigh School Graduate    Tobacco Use/Exposure:  Yes - She is smoking 5-6 cigs per day.  She has smoked about 10 years.     Alcohol Use:  Occasional wine, beer   Drug Use:  None   Diet:  Regular   Exercise:  Walking, Softball, Volleyball    Hobbies:  Reading     Family History  Problem Relation Age of Onset  . Heart disease Father   . Diabetes Father   . Diabetes Sister   . Hypertension Sister   . Heart disease Sister   . Heart disease Maternal Grandfather      Current Outpatient Prescriptions on File Prior to Visit  Medication Sig Dispense Refill  . EPINEPHrine (EPIPEN JR) 0.15 MG/0.3ML injection Inject  0.15 mg into the muscle as needed for anaphylaxis.      Marland Kitchen. HYDROcodone-acetaminophen (NORCO) 10-325 MG per tablet Take 1 tablet by mouth every 6 (six) hours as needed.      . Multiple Vitamins-Minerals (WOMENS 50+ MULTI VITAMIN/MIN) TABS Take by mouth every morning.      . Vit B6-Vit B12-Omega 3 Acids (VITAMIN B PLUS+ PO) Take by mouth every morning.       No current facility-administered medications on file prior to visit.     Allergies  Allergen Reactions  . Bee Venom Hives      Objective:   Physical Exam  Nursing note and vitals reviewed. Constitutional: She appears well-nourished. No distress.  Cardiovascular: Normal rate, regular rhythm and normal heart sounds.   Pulmonary/Chest: Effort normal and breath sounds normal.  Neurological: She is alert.  Psychiatric: Her speech is normal and behavior is normal. Judgment and thought content normal. Her mood appears anxious. Her affect is not inappropriate. Cognition and memory are normal.      Assessment & Plan:    Dana Mcdonald was seen today for medication management.  Diagnoses and associated orders for this visit:  Concentration deficit - lisdexamfetamine (VYVANSE) 50 MG capsule; Take 1 capsule (50 mg total) by mouth every morning.  Anxiety state, unspecified

## 2013-08-12 ENCOUNTER — Other Ambulatory Visit: Payer: Self-pay | Admitting: *Deleted

## 2013-08-12 MED ORDER — EPINEPHRINE 0.3 MG/0.3ML IJ SOAJ: 0.3000 mg | Freq: Once | INTRAMUSCULAR | Status: DC

## 2013-12-15 ENCOUNTER — Telehealth: Payer: Self-pay

## 2013-12-15 NOTE — Telephone Encounter (Signed)
Emlyn wants you to call her she has some questions about her IUD [phone]

## 2014-12-18 ENCOUNTER — Encounter (HOSPITAL_BASED_OUTPATIENT_CLINIC_OR_DEPARTMENT_OTHER): Payer: Self-pay

## 2014-12-18 ENCOUNTER — Emergency Department (HOSPITAL_BASED_OUTPATIENT_CLINIC_OR_DEPARTMENT_OTHER)
Admission: EM | Admit: 2014-12-18 | Discharge: 2014-12-18 | Disposition: A | Discharge status: Home / Self Care | Payer: Medicaid Other | Attending: Emergency Medicine | Admitting: Emergency Medicine

## 2014-12-18 ENCOUNTER — Emergency Department (HOSPITAL_BASED_OUTPATIENT_CLINIC_OR_DEPARTMENT_OTHER): Payer: Medicaid Other

## 2014-12-18 DIAGNOSIS — M21372 Foot drop, left foot: Secondary | ICD-10-CM | POA: Diagnosis not present

## 2014-12-18 DIAGNOSIS — M545 Low back pain: Secondary | ICD-10-CM | POA: Insufficient documentation

## 2014-12-18 DIAGNOSIS — Z720 Tobacco use: Secondary | ICD-10-CM | POA: Diagnosis not present

## 2014-12-18 DIAGNOSIS — G8929 Other chronic pain: Secondary | ICD-10-CM | POA: Diagnosis not present

## 2014-12-18 DIAGNOSIS — M25551 Pain in right hip: Secondary | ICD-10-CM | POA: Diagnosis not present

## 2014-12-18 DIAGNOSIS — Z862 Personal history of diseases of the blood and blood-forming organs and certain disorders involving the immune mechanism: Secondary | ICD-10-CM | POA: Diagnosis not present

## 2014-12-18 DIAGNOSIS — Z8679 Personal history of other diseases of the circulatory system: Secondary | ICD-10-CM | POA: Diagnosis not present

## 2014-12-18 DIAGNOSIS — M419 Scoliosis, unspecified: Secondary | ICD-10-CM | POA: Insufficient documentation

## 2014-12-18 HISTORY — DX: Other intervertebral disc degeneration, lumbar region without mention of lumbar back pain or lower extremity pain: M51.369

## 2014-12-18 HISTORY — DX: Scoliosis, unspecified: M41.9

## 2014-12-18 HISTORY — DX: Other intervertebral disc degeneration, lumbar region: M51.36

## 2014-12-18 MED ORDER — KETOROLAC TROMETHAMINE 30 MG/ML IJ SOLN
30.0000 mg | Freq: Once | INTRAMUSCULAR | Status: AC
  Administered 2014-12-18: 30 mg via INTRAMUSCULAR
  Filled 2014-12-18: qty 1

## 2014-12-18 NOTE — Discharge Instructions (Signed)
Your x-rays did not show any acute problems. Please see her primary care doctor as scheduled, and discuss any additional workup if necessary. Return without fail for worsening symptoms, including fever, inability to walk, new weakness or numbness, loss of control of her bowel or bladder, or any other symptoms concerning to. Continue to take motrin/tylenol for pain control as needed.

## 2014-12-18 NOTE — ED Notes (Signed)
Pt reports right hip pain x3 days - denies known injury, however, pt appears to have asymmetry on the right side - reports she does have scoliosis, no lumbar discs, with nerve damage on the right and known twisted pelvis.

## 2014-12-18 NOTE — ED Provider Notes (Signed)
CSN: [id number]     Arrival date & time 12/18/14  1309 History   First MD Initiated Contact with Patient 12/18/14 1334     Chief Complaint  Patient presents with  . Hip Problem     (Consider location/radiation/quality/duration/timing/severity/associated sxs/prior Treatment)  HPI 34 year old female who presents with low back pain and right hip pain. 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 Reports chronic low back pain with left radiculopathy and left foot drop. For past three days she has noticed progressively worsening right hip pain with radiates from her low back. Worse with ambulation, sitting, and range of motion of her hip. Denies trauma, exertional activities, fever, chills, new numbness or weakness, loss of control of bowel or bladder.   Past Medical History  Diagnosis Date  . Migraine   . Allergy   . Leukocytosis   . Scoliosis   . Disc degeneration, lumbar    Past Surgical History  Procedure Laterality Date  . Cholecystectomy    . Intrauterine device (iud) insertion     Family History  Problem Relation Age of Onset  . Heart disease Father   . Diabetes Father   . Diabetes Sister   . Hypertension Sister   . Heart disease Sister   . Heart disease Maternal Grandfather    History  Substance Use Topics  . Smoking status: Current Every Day Smoker -- 0.50 packs/day    Types: Cigarettes    Start date: 04/14/2013  . Smokeless tobacco: Never Used  . Alcohol Use: Not on file   OB History    No data available     Review of Systems 10/14 systems reviewed and are negative other than those stated in the HPI    Allergies  Bee venom  Home Medications   Prior to Admission medications   Medication Sig Start Date End Date Taking? Authorizing Provider  EPINEPHrine (EPIPEN JR) 0.15 MG/0.3ML injection Inject 0.15 mg into the muscle as needed for anaphylaxis.   Yes Historical Provider, MD   HYDROcodone-acetaminophen (NORCO) 10-325 MG per tablet Take 1 tablet by mouth every 6 (six) hours as needed.   Yes Historical Provider, MD  lisdexamfetamine (VYVANSE) 50 MG capsule Take 1 capsule (50 mg total) by mouth every morning. 07/09/13  Yes Gillian Scarce, MD  Multiple Vitamins-Minerals (WOMENS 50+ MULTI VITAMIN/MIN) TABS Take by mouth every morning.    Historical Provider, MD  Vit B6-Vit B12-Omega 3 Acids (VITAMIN B PLUS+ PO) Take by mouth every morning.    Historical Provider, MD   BP 151/93 mmHg  Pulse 109  Temp(Src) 98.4 F (36.9 C) (Oral)  Resp 18  Ht  (1.6 m)  Wt 165 lb (74.844 kg)  BMI 29.24 kg/m2  SpO2 98%  LMP 12/11/2014 Physical Exam  Nursing note and vitals reviewed. Physical Exam  Constitutional: Well developed, well nourished, non-toxic, and in no acute distress. Appears uncomfortable due to pain Head: Normocephalic and atraumatic.  Mouth/Throat: Oropharynx is clear and moist.  Neck: Normal range of motion. Neck supple.  Cardiovascular: Normal rate and regular rhythm.   Pulmonary/Chest: Effort normal.  Abdominal: Soft. There is no tenderness. There is no rebound and no guarding.  Musculoskeletal: Negative straight leg raise bilaterally. No deformities. Tender to palpation of right hip with pain with range of motion. Neurological: Alert, Sensation to light touch in tact in all nerve root distributions of the lower extremities. Patellar reflexes +2 bilaterally. Full strength in ankle and knee  flexion extension n of RLE. Limited motor testing of hip due to pain. Baseline foot drop of LLE, but full strength ankle dorsiflexion, knee flexion/tenderness Skin: Skin is warm and dry.  Psychiatric: Cooperative   ED Course  Procedures (including critical care time) Labs Review Labs Reviewed - No data to display  Imaging Review No results found.   EKG Interpretation None      MDM   Final diagnoses:  None     34 year old female who presents with right  hip pain with radiation to the back. She is well appearing non-toxic and in no acute distress. Appears uncomfortable secondary to pain. She is grossly neurologically in tact. Able to ambulate. XR of LS spine and hip performed and visualized, revealing degenerative disk disease of her lumbar spine. Given analgesics to some good effect. No bowel/urinary symptoms concerning for spinal cord involvement. Question if she may have new radicular symptoms, and discussed with her the need for MRI for evaluation. She states that she has follow-up with her PCP in 2 hours, and will discuss MRI with her PCP today as outpatient. Strict return and follow-up instructions reviewed. She expressed understanding of all discharge instructions and felt comfortable with the plan of care.     Lavera Guise, MD 12/19/14 1040

## 2020-11-28 ENCOUNTER — Other Ambulatory Visit: Payer: Self-pay

## 2020-11-28 ENCOUNTER — Emergency Department (HOSPITAL_BASED_OUTPATIENT_CLINIC_OR_DEPARTMENT_OTHER)
Admission: EM | Admit: 2020-11-28 | Discharge: 2020-11-28 | Disposition: A | Discharge status: Home / Self Care | Payer: Self-pay | Attending: Emergency Medicine | Admitting: Emergency Medicine

## 2020-11-28 ENCOUNTER — Emergency Department (HOSPITAL_BASED_OUTPATIENT_CLINIC_OR_DEPARTMENT_OTHER): Payer: Self-pay

## 2020-11-28 ENCOUNTER — Encounter (HOSPITAL_BASED_OUTPATIENT_CLINIC_OR_DEPARTMENT_OTHER): Payer: Self-pay

## 2020-11-28 DIAGNOSIS — F17210 Nicotine dependence, cigarettes, uncomplicated: Secondary | ICD-10-CM | POA: Insufficient documentation

## 2020-11-28 DIAGNOSIS — M79671 Pain in right foot: Secondary | ICD-10-CM | POA: Insufficient documentation

## 2020-11-28 DIAGNOSIS — M79661 Pain in right lower leg: Secondary | ICD-10-CM | POA: Insufficient documentation

## 2020-11-28 MED ORDER — METHOCARBAMOL 500 MG PO TABS: 500.0000 mg | ORAL_TABLET | Freq: Two times a day (BID) | ORAL | 0 refills | Status: DC

## 2020-11-28 NOTE — ED Provider Notes (Signed)
[facility] EMERGENCY DEPARTMENT Provider Note   CSN: [id number] Arrival date & time: 11/28/20  1636     History Chief Complaint  Patient presents with   Foot Pain    Dana Mcdonald is a 40 y.o. female.  HPI Patient is a 40 year old female presented to the ER today with right foot and ankle pain that is been ongoing for 1 week she states it is not traumatic it began while she was walking briskly up a concrete ramp.  She states that it is achy constant has been worse with movement and touch.  She states it is progressively worsened but seems to have been mostly constant not significantly worsened.  Denies any other injuries.  She did not fall or twist her ankle.  She denies any weakness or numbness or tingling.  No other associate symptoms.    Past Medical History:  Diagnosis Date   Allergy    Disc degeneration, lumbar    Leukocytosis    Migraine    Scoliosis     Patient Active Problem List   Diagnosis Date Noted   Anxiety state, unspecified 07/06/2013   Depressive disorder, not elsewhere classified 07/06/2013   Smoking trying to quit 04/16/2013   URI (upper respiratory infection) 08/31/2012    Past Surgical History:  Procedure Laterality Date   CESAREAN SECTION     CHOLECYSTECTOMY     INTRAUTERINE DEVICE (IUD) INSERTION       OB History   No obstetric history on file.     Family History  Problem Relation Age of Onset   Heart disease Father    Diabetes Father    Diabetes Sister    Hypertension Sister    Heart disease Sister    Heart disease Maternal Grandfather     Social History   Tobacco Use   Smoking status: Every Day    Packs/day: 0.50    Types: Cigarettes    Start date: 04/14/2013   Smokeless tobacco: Never  Vaping Use   Vaping Use: Never used  Substance Use Topics   Alcohol use: Never    Alcohol/week: 2.0 standard drinks    Types: 2 Standard drinks or equivalent per week   Drug use: No    Home Medications Prior to  Admission medications   Medication Sig Start Date End Date Taking? Authorizing Provider  methocarbamol (ROBAXIN) 500 MG tablet Take 1 tablet (500 mg total) by mouth 2 (two) times daily. 11/28/20  Yes Margart Zemanek S, PA  EPINEPHrine (EPIPEN JR) 0.15 MG/0.3ML injection Inject 0.15 mg into the muscle as needed for anaphylaxis.    [provider]  HYDROcodone-acetaminophen (NORCO) 10-325 MG per tablet Take 1 tablet by mouth every 6 (six) hours as needed.    [provider]  lisdexamfetamine (VYVANSE) 50 MG capsule Take 1 capsule (50 mg total) by mouth every morning. 07/09/13   Zanard, Hinton Dyer, MD  Multiple Vitamins-Minerals (WOMENS 50+ MULTI VITAMIN/MIN) TABS Take by mouth every morning.    [provider]  Vit B6-Vit B12-Omega 3 Acids (VITAMIN B PLUS+ PO) Take by mouth every morning.    [provider]    Allergies    Bee venom  Review of Systems   Review of Systems  Constitutional:  Negative for fever.  HENT:  Negative for congestion.   Respiratory:  Negative for shortness of breath.   Cardiovascular:  Negative for chest pain.  Gastrointestinal:  Negative for abdominal distention.  Musculoskeletal:  Right leg/right ankle/right foot pain  Neurological:  Negative for dizziness and headaches.   Physical Exam Updated Vital Signs BP 137/90 (BP Location: Right Arm)   Pulse 78   Temp 98.6 F (37 C) (Oral)   Resp 20   Ht 5\' 3"  (1.6 m)   Wt 81.6 kg   LMP 11/22/2020   SpO2 95%   BMI 31.89 kg/m   Physical Exam Vitals and nursing note reviewed.  Constitutional:      General: She is not in acute distress.    Appearance: Normal appearance. She is not ill-appearing.  HENT:     Head: Normocephalic and atraumatic.  Eyes:     General: No scleral icterus.       Right eye: No discharge.        Left eye: No discharge.     Conjunctiva/sclera: Conjunctivae normal.  Pulmonary:     Effort: Pulmonary effort is normal.     Breath sounds: No stridor.   Musculoskeletal:     Comments: Right lower extremity with tenderness to palpation of the right heel there is some diffuse tenderness of the Bottom of the foot.  Some diffuse ankle TTP no focal TTP.  Able to flex and extend toes against resistance 5/5 strength.  Skin:    Comments: No rashes or lesions of the skin.  Neurological:     Mental Status: She is alert and oriented to person, place, and time. Mental status is at baseline.    ED Results / Procedures / Treatments   Labs (all labs ordered are listed, but only abnormal results are displayed) Labs Reviewed - No data to display  EKG None  Radiology DG Ankle Complete Right  Result Date: 11/28/2020 CLINICAL DATA:  Right foot injury 1 week ago, pain and swelling, right calf pain EXAM: RIGHT ANKLE - COMPLETE 3+ VIEW; RIGHT FOOT COMPLETE - 3+ VIEW COMPARISON:  None. FINDINGS: Right ankle: Frontal, oblique, lateral views of the right ankle are obtained. No fracture, subluxation, or dislocation. Joint spaces are well preserved. Soft tissues are normal. Right foot: Frontal, oblique, and lateral views demonstrate no acute fracture, subluxation, or dislocation. Joint spaces are well preserved. There is a large inferior calcaneal spur. Soft tissues are unremarkable. IMPRESSION: 1. No acute fracture within the right foot or ankle. 2. Large inferior calcaneal spur. Electronically Signed   By: 11/30/2020 M.D.   On: 11/28/2020 17:22   DG Foot Complete Right  Result Date: 11/28/2020 CLINICAL DATA:  Right foot injury 1 week ago, pain and swelling, right calf pain EXAM: RIGHT ANKLE - COMPLETE 3+ VIEW; RIGHT FOOT COMPLETE - 3+ VIEW COMPARISON:  None. FINDINGS: Right ankle: Frontal, oblique, lateral views of the right ankle are obtained. No fracture, subluxation, or dislocation. Joint spaces are well preserved. Soft tissues are normal. Right foot: Frontal, oblique, and lateral views demonstrate no acute fracture, subluxation, or dislocation. Joint spaces  are well preserved. There is a large inferior calcaneal spur. Soft tissues are unremarkable. IMPRESSION: 1. No acute fracture within the right foot or ankle. 2. Large inferior calcaneal spur. Electronically Signed   By: 11/30/2020 M.D.   On: 11/28/2020 17:22    Procedures Procedures   Medications Ordered in ED Medications - No data to display  ED Course  I have reviewed the triage vital signs and the nursing notes.  Pertinent labs & imaging results that were available during my care of the patient were reviewed by me and considered in my medical decision making (  see chart for details).    MDM Rules/Calculators/A&P                          Patient had atraumatic right heel pain x-rays unremarkable apart from large heel spur which is likely causing some of her pain.  Low suspicion for muscle tear or muscle injury however she does seem to have some tenderness of the foot which may be either plantar fasciitis, pain from heel spur, muscular strain.  Doubt DVT.  Patient has good bilateral DP PT pulses 3+ and symmetric making arterial ischemia unlikely.  Ultimately patient is overall quite well-appearing.  X-rays ruled out fractures.  Will discharge patient home with conservative therapy and follow-up with orthopedics.  Final Clinical Impression(s) / ED Diagnoses Final diagnoses:  Right foot pain  Pain of right heel    Rx / DC Orders ED Discharge Orders          Ordered    methocarbamol (ROBAXIN) 500 MG tablet  2 times daily        11/28/20 1804             Gailen Shelter, [place] 11/28/20 1830    Melene Plan, DO 11/28/20 1849

## 2020-11-28 NOTE — ED Triage Notes (Signed)
Pt injured her foot 1 week ago while running up a concrete incline. Pt states pain and swelling increased throughout the week. Pt now has pain in back of R calf.

## 2020-11-28 NOTE — Discharge Instructions (Addendum)
  These rest ice and elevate your foot.  Please follow-up with [facility].  Please drink plenty of water.  Take Tylenol and ibuprofen as discussed below. I have also given you the information for the orthopedics office.  I have also prescribed you a muscle relaxer called Robaxin to take as needed for pain that is not controlled by Tylenol and ibuprofen.  Please use Tylenol or ibuprofen for pain.  You may use 600 mg ibuprofen every 6 hours or 1000 mg of Tylenol every 6 hours.  You may choose to alternate between the 2.  This would be most effective.  Not to exceed 4 g of Tylenol within 24 hours.  Not to exceed 3200 mg ibuprofen 24 hours.

## 2020-11-28 NOTE — ED Notes (Signed)
Pt teaching provided on medications that may cause drowsiness. Pt instructed not to drive or operate heavy machinery while taking the prescribed medication. Pt verbalized understanding.  ? ?Pt provided discharge instructions and prescription information. Pt was given the opportunity to ask questions and questions were answered. Discharge signature not obtained in the setting of the COVID-19 pandemic in order to reduce high touch surfaces.  ? ?

## 2020-11-30 ENCOUNTER — Encounter: Payer: Self-pay | Admitting: Family Medicine

## 2020-11-30 ENCOUNTER — Other Ambulatory Visit: Payer: Self-pay

## 2020-11-30 ENCOUNTER — Ambulatory Visit: Payer: Self-pay

## 2020-11-30 ENCOUNTER — Ambulatory Visit (INDEPENDENT_AMBULATORY_CARE_PROVIDER_SITE_OTHER): Payer: Self-pay | Admitting: Family Medicine

## 2020-11-30 VITALS — BP 130/90 | Ht 63.0 in | Wt 180.0 lb

## 2020-11-30 DIAGNOSIS — S93691A Other sprain of right foot, initial encounter: Secondary | ICD-10-CM

## 2020-11-30 DIAGNOSIS — M79671 Pain in right foot: Secondary | ICD-10-CM

## 2020-11-30 MED ORDER — AMBULATORY NON FORMULARY MEDICATION: 0 refills | Status: AC

## 2020-11-30 MED ORDER — HYDROCODONE-ACETAMINOPHEN 5-325 MG PO TABS: 1.0000 | ORAL_TABLET | Freq: Two times a day (BID) | ORAL | 0 refills | Status: AC | PRN

## 2020-11-30 MED ORDER — MELOXICAM 7.5 MG PO TABS: 7.5000 mg | ORAL_TABLET | Freq: Two times a day (BID) | ORAL | 1 refills | Status: DC | PRN

## 2020-11-30 MED ORDER — AMBULATORY NON FORMULARY MEDICATION: 0 refills | Status: DC

## 2020-11-30 NOTE — Assessment & Plan Note (Signed)
Findings consistent with rupture of the plantar fascia.  She felt a pop at the bottom of her foot when her symptoms first initiated. -Counseled on home exercise therapy and supportive care. -Counseled on compression. -Mobic. -Norco. -Counseled on nonweightbearing -Rolling knee scooter. -Follow-up in 2 weeks.

## 2020-11-30 NOTE — Progress Notes (Signed)
Dana Mcdonald - 40 y.o. female MRN [id number]  Date of birth: 1980/08/12  SUBJECTIVE:  Including CC & ROS.  No chief complaint on file.   Dana Mcdonald is a 40 y.o. female that is presenting with right foot pain.  Her pain started few days ago when she was pushing off and felt a pop in the bottom of her foot.  Since that time she has been able to bear weight.  No history of similar pain having swelling in the bottom of her foot.  Independent review of the right foot x-ray from 7/17 shows a calcaneal plantar heel spur.  Independent review of the right ankle x-ray from 7/17 shows no acute changes.   Review of Systems See HPI   HISTORY: Past Medical, Surgical, Social, and Family History Reviewed & Updated per EMR.   Pertinent Historical Findings include:  Past Medical History:  Diagnosis Date   Allergy    Disc degeneration, lumbar    Leukocytosis    Migraine    Scoliosis     Past Surgical History:  Procedure Laterality Date   CESAREAN SECTION     CHOLECYSTECTOMY     INTRAUTERINE DEVICE (IUD) INSERTION      Family History  Problem Relation Age of Onset   Heart disease Father    Diabetes Father    Diabetes Sister    Hypertension Sister    Heart disease Sister    Heart disease Maternal Grandfather     Social History   Socioeconomic History   Marital status: Single    Spouse name: Not on file   Number of children: 2   Years of education: 12   Highest education level: Not on file  Occupational History   Occupation: Homemaker   Tobacco Use   Smoking status: Every Day    Packs/day: 0.50    Types: Cigarettes    Start date: 04/14/2013   Smokeless tobacco: Never  Vaping Use   Vaping Use: Never used  Substance and Sexual Activity   Alcohol use: Never    Alcohol/week: 2.0 standard drinks    Types: 2 Standard drinks or equivalent per week   Drug use: No   Sexual activity: Yes    Partners: Male  Other Topics Concern   Not on file  Social History Narrative    Marital Status: Single ([place])    Children:  Sons [name], [place])    Pets: None   Living Situation: Lives alone with her mother   Occupation: Unemployed   Education:  Engineer, agricultural    Tobacco Use/Exposure:  Yes - She is smoking 5-6 cigs per day.  She has smoked about 10 years.     Alcohol Use:  Occasional wine, beer   Drug Use:  None   Diet:  Regular   Exercise:  Walking, Softball, Volleyball    Hobbies:  Reading   Social Determinants of Health   Financial Resource Strain: Not on file  Food Insecurity: Not on file  Transportation Needs: Not on file  Physical Activity: Not on file  Stress: Not on file  Social Connections: Not on file  Intimate Partner Violence: Not on file     PHYSICAL EXAM:  VS: BP 130/90 (BP Location: Left Arm, Patient Position: Sitting, Cuff Size: Large)   Ht 5\' 3"  (1.6 m)   Wt 180 lb (81.6 kg)   LMP 11/22/2020   BMI 31.89 kg/m  Physical Exam Gen: NAD, alert, cooperative with exam, well-appearing MSK:  Right foot: Tenderness to  palpation of the calcaneus on the plantar aspect. Limited range of motion. Swelling appreciated. Neurovascular intact  Limited ultrasound: Right foot:  Normal appearing Achilles. No effusion in the ankle joint. Normal-appearing posterior tibialis and insertion. Hypoechoic change and widening of the plantar fascia to suspect a rupture  Summary: Plantar fascial rupture  Ultrasound and interpretation by Clare Gandy, MD    ASSESSMENT & PLAN:   Rupture of plantar fascia of right foot Findings consistent with rupture of the plantar fascia.  She felt a pop at the bottom of her foot when her symptoms first initiated. -Counseled on home exercise therapy and supportive care. -Counseled on compression. -Mobic. -Norco. -Counseled on nonweightbearing -Rolling knee scooter. -Follow-up in 2 weeks.

## 2020-11-30 NOTE — Patient Instructions (Signed)
Nice to meet you Please continue the compression  Please try ice  Please use the scooter   Please send me a message in MyChart with any questions or updates.  Please see me back in 2 weeks.   --Dr. Jordan Likes

## 2020-12-21 ENCOUNTER — Ambulatory Visit (INDEPENDENT_AMBULATORY_CARE_PROVIDER_SITE_OTHER): Payer: Self-pay | Admitting: Family Medicine

## 2020-12-21 ENCOUNTER — Ambulatory Visit: Payer: Self-pay

## 2020-12-21 ENCOUNTER — Other Ambulatory Visit: Payer: Self-pay

## 2020-12-21 ENCOUNTER — Encounter: Payer: Self-pay | Admitting: Family Medicine

## 2020-12-21 VITALS — BP 138/90 | Ht 63.0 in | Wt 180.0 lb

## 2020-12-21 DIAGNOSIS — S93691D Other sprain of right foot, subsequent encounter: Secondary | ICD-10-CM

## 2020-12-21 DIAGNOSIS — M7751 Other enthesopathy of right foot: Secondary | ICD-10-CM | POA: Insufficient documentation

## 2020-12-21 NOTE — Patient Instructions (Signed)
Good to see you Please continue compression and scooter  Please try ice   Please send me a message in MyChart with any questions or updates.  Please see me back in 2-3 weeks.   --Dr. Jordan Likes

## 2020-12-21 NOTE — Assessment & Plan Note (Addendum)
Still having ongoing pain but has improved compared to when her initial injury occurred.  Having swelling In areas that are likely leading to the pain she experiences in the midfoot and lateral hindfoot -Counseled on home exercise therapy and supportive care. -Provided Cam walker. -Is currently taking meloxicam.  Could consider prednisone.

## 2020-12-21 NOTE — Progress Notes (Signed)
Dana Mcdonald - 40 y.o. female MRN [id number]  Date of birth: 20-Apr-1981  SUBJECTIVE:  Including CC & ROS.  No chief complaint on file.   Dana Mcdonald is a 40 y.o. female that is following up for her right foot and ankle pain.  The plantar aspect is still fairly painful as well as the posterior and lateral aspect of the ankle.  Has severe pain with any weightbearing.  Has been wearing compression which does seem to improve her symptoms..   Review of Systems See HPI   HISTORY: Past Medical, Surgical, Social, and Family History Reviewed & Updated per EMR.   Pertinent Historical Findings include:  Past Medical History:  Diagnosis Date   Allergy    Disc degeneration, lumbar    Leukocytosis    Migraine    Scoliosis     Past Surgical History:  Procedure Laterality Date   CESAREAN SECTION     CHOLECYSTECTOMY     INTRAUTERINE DEVICE (IUD) INSERTION      Family History  Problem Relation Age of Onset   Heart disease Father    Diabetes Father    Diabetes Sister    Hypertension Sister    Heart disease Sister    Heart disease Maternal Grandfather     Social History   Socioeconomic History   Marital status: Single    Spouse name: Not on file   Number of children: 2   Years of education: 12   Highest education level: Not on file  Occupational History   Occupation: Homemaker   Tobacco Use   Smoking status: Every Day    Packs/day: 0.50    Types: Cigarettes    Start date: 04/14/2013   Smokeless tobacco: Never  Vaping Use   Vaping Use: Never used  Substance and Sexual Activity   Alcohol use: Never    Alcohol/week: 2.0 standard drinks    Types: 2 Standard drinks or equivalent per week   Drug use: No   Sexual activity: Yes    Partners: Male  Other Topics Concern   Not on file  Social History Narrative   Marital Status: Single ([place])    Children:  [place]ons [name], [place])    Pets: None   Living Situation: Lives alone with her mother   Occupation: Unemployed    Education:  Engineer, agricultural    Tobacco Use/Exposure:  Yes - She is smoking 5-6 cigs per day.  She has smoked about 10 years.     Alcohol Use:  Occasional wine, beer   Drug Use:  None   Diet:  Regular   Exercise:  Walking, Softball, Volleyball    Hobbies:  Reading   Social Determinants of Health   Financial Resource Strain: Not on file  Food Insecurity: Not on file  Transportation Needs: Not on file  Physical Activity: Not on file  Stress: Not on file  Social Connections: Not on file  Intimate Partner Violence: Not on file     PHYSICAL EXAM:  VS: BP 138/90 (BP Location: Left Arm, Patient Position: Sitting, Cuff Size: Large)   Ht 5\' 3"  (1.6 m)   Wt 180 lb (81.6 kg)   LMP 11/22/2020   BMI 31.89 kg/m  Physical Exam Gen: NAD, alert, cooperative with exam, well-appearing MSK:  Right ankle: Tenderness to palpation of the posterior compartment. Limited range of motion. Swelling occurring over the lateral hindfoot. Neurovascularly intact  Limited ultrasound: Right ankle:  No effusion within the ankle joint. Large retrocalcaneal bursitis. Effusion appreciated within  the tendon sheath of the peroneal tendons with normal insertion into the base of the fifth metatarsal. Mild encircling effusion around the posterior tibialis. Ongoing rupture of the plantar fascia  Summary: Ongoing ruptured plantar fascia with a large retrocalcaneal bursitis  Ultrasound and interpretation by Clare Gandy, MD    ASSESSMENT & PLAN:   Rupture of plantar fascia of right foot Still having ongoing pain but has improved compared to when her initial injury occurred.  Having swelling In areas that are likely leading to the pain she experiences in the midfoot and lateral hindfoot -Counseled on home exercise therapy and supportive care. -Provided Cam walker. -Is currently taking meloxicam.  Could consider prednisone.  Postcalcaneal bursitis of right foot Appears to have a large bursitis on  exam following the rupture of the plantar fascia.  No effusion noted in the ankle joint. -Counseled on home exercise therapy and supportive care. -Cam walker. -Could consider injection or further imaging.

## 2020-12-21 NOTE — Assessment & Plan Note (Signed)
Appears to have a large bursitis on exam following the rupture of the plantar fascia.  No effusion noted in the ankle joint. -Counseled on home exercise therapy and supportive care. -Cam walker. -Could consider injection or further imaging.

## 2021-01-04 ENCOUNTER — Ambulatory Visit: Payer: Self-pay | Admitting: Family Medicine

## 2021-01-13 ENCOUNTER — Ambulatory Visit: Payer: Self-pay | Admitting: Family Medicine

## 2021-01-13 NOTE — Progress Notes (Deleted)
  Dana Mcdonald - 40 y.o. female MRN [id number]  Date of birth: [birth year]  SUBJECTIVE:  Including CC & ROS.  No chief complaint on file.   Dana Mcdonald is a 40 y.o. female that is  ***.  ***   Review of Systems See HPI   HISTORY: Past Medical, Surgical, Social, and Family History Reviewed & Updated per EMR.   Pertinent Historical Findings include:  Past Medical History:  Diagnosis Date   Allergy    Disc degeneration, lumbar    Leukocytosis    Migraine    Scoliosis     Past Surgical History:  Procedure Laterality Date   CESAREAN SECTION     CHOLECYSTECTOMY     INTRAUTERINE DEVICE (IUD) INSERTION      Family History  Problem Relation Age of Onset   Heart disease Father    Diabetes Father    Diabetes Sister    Hypertension Sister    Heart disease Sister    Heart disease Maternal Grandfather     Social History   Socioeconomic History   Marital status: Single    Spouse name: Not on file   Number of children: 2   Years of education: 12   Highest education level: Not on file  Occupational History   Occupation: Homemaker   Tobacco Use   Smoking status: Every Day    Packs/day: 0.50    Types: Cigarettes    Start date: 04/14/2013   Smokeless tobacco: Never  Vaping Use   Vaping Use: Never used  Substance and Sexual Activity   Alcohol use: Never    Alcohol/week: 2.0 standard drinks    Types: 2 Standard drinks or equivalent per week   Drug use: No   Sexual activity: Yes    Partners: Male  Other Topics Concern   Not on file  Social History Narrative   Marital Status: Single ([place])    Children:  Sons [name], [place])    Pets: None   Living Situation: Lives alone with her mother   Occupation: Unemployed   Education:  Engineer, agricultural    Tobacco Use/Exposure:  Yes - She is smoking 5-6 cigs per day.  She has smoked about 10 years.     Alcohol Use:  Occasional wine, beer   Drug Use:  None   Diet:  Regular   Exercise:  Walking, Softball,  Volleyball    Hobbies:  Reading   Social Determinants of Health   Financial Resource Strain: Not on file  Food Insecurity: Not on file  Transportation Needs: Not on file  Physical Activity: Not on file  Stress: Not on file  Social Connections: Not on file  Intimate Partner Violence: Not on file     PHYSICAL EXAM:  VS: There were no vitals taken for this visit. Physical Exam Gen: NAD, alert, cooperative with exam, well-appearing MSK:  ***      ASSESSMENT & PLAN:   No problem-specific Assessment & Plan notes found for this encounter.

## 2022-02-02 IMAGING — DX DG FOOT COMPLETE 3+V*R*
3 series · 3 of 3 positions shown · non-contrast
Comparison: None.

CLINICAL DATA: Right foot injury 1 week ago, pain and swelling,
right calf pain

EXAM:
RIGHT ANKLE - COMPLETE 3+ VIEW; RIGHT FOOT COMPLETE - 3+ VIEW

[foot ap]
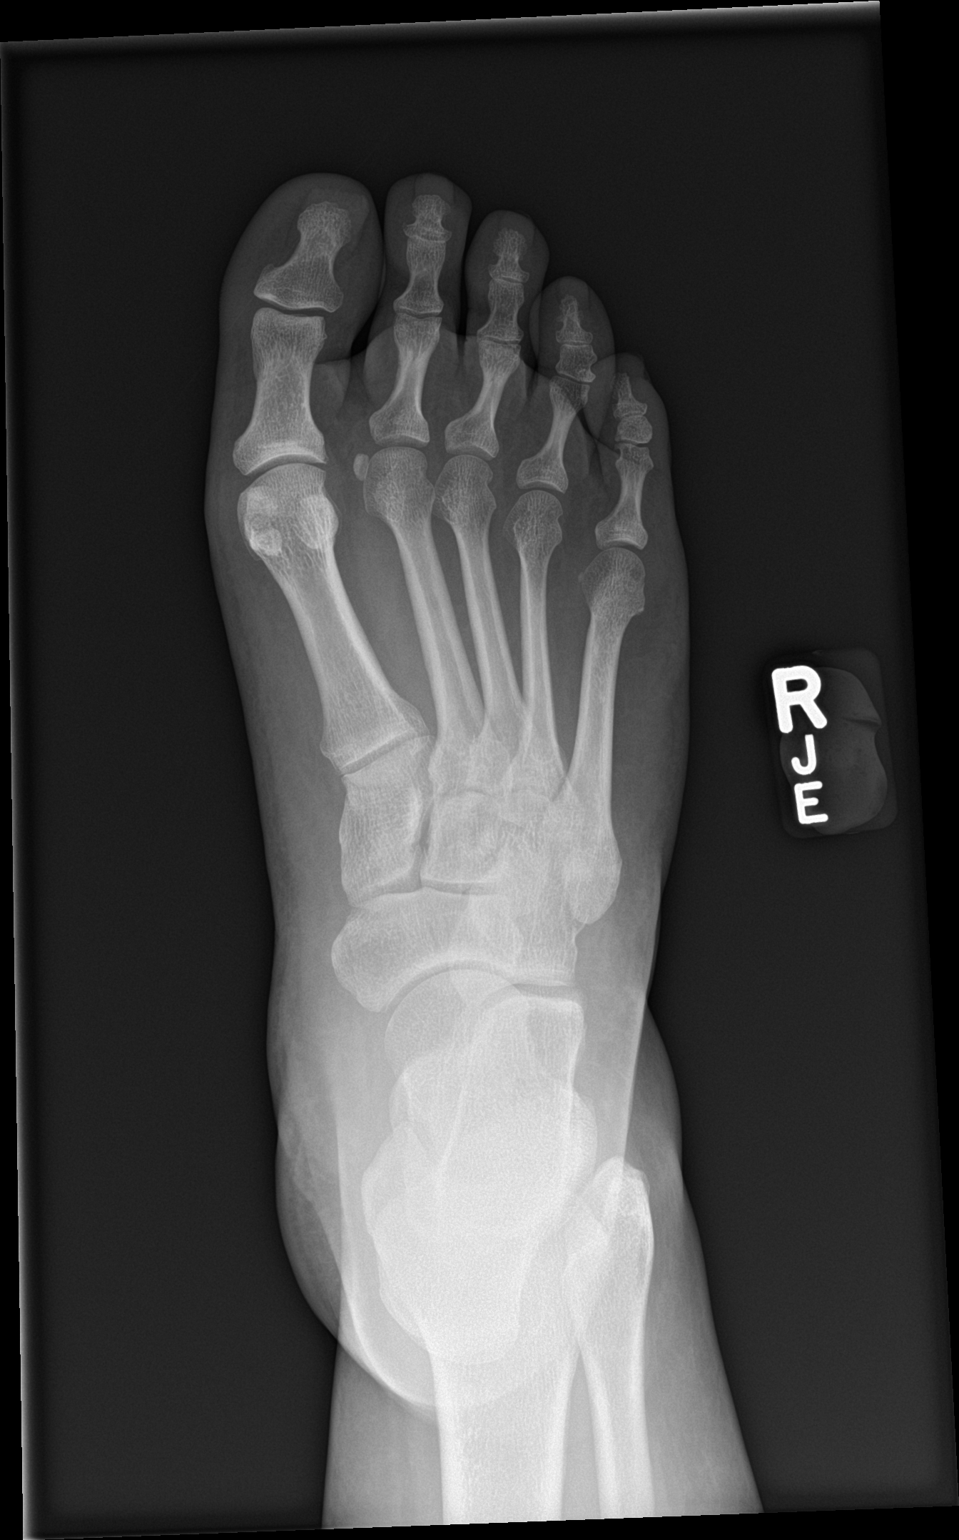

[foot obl]
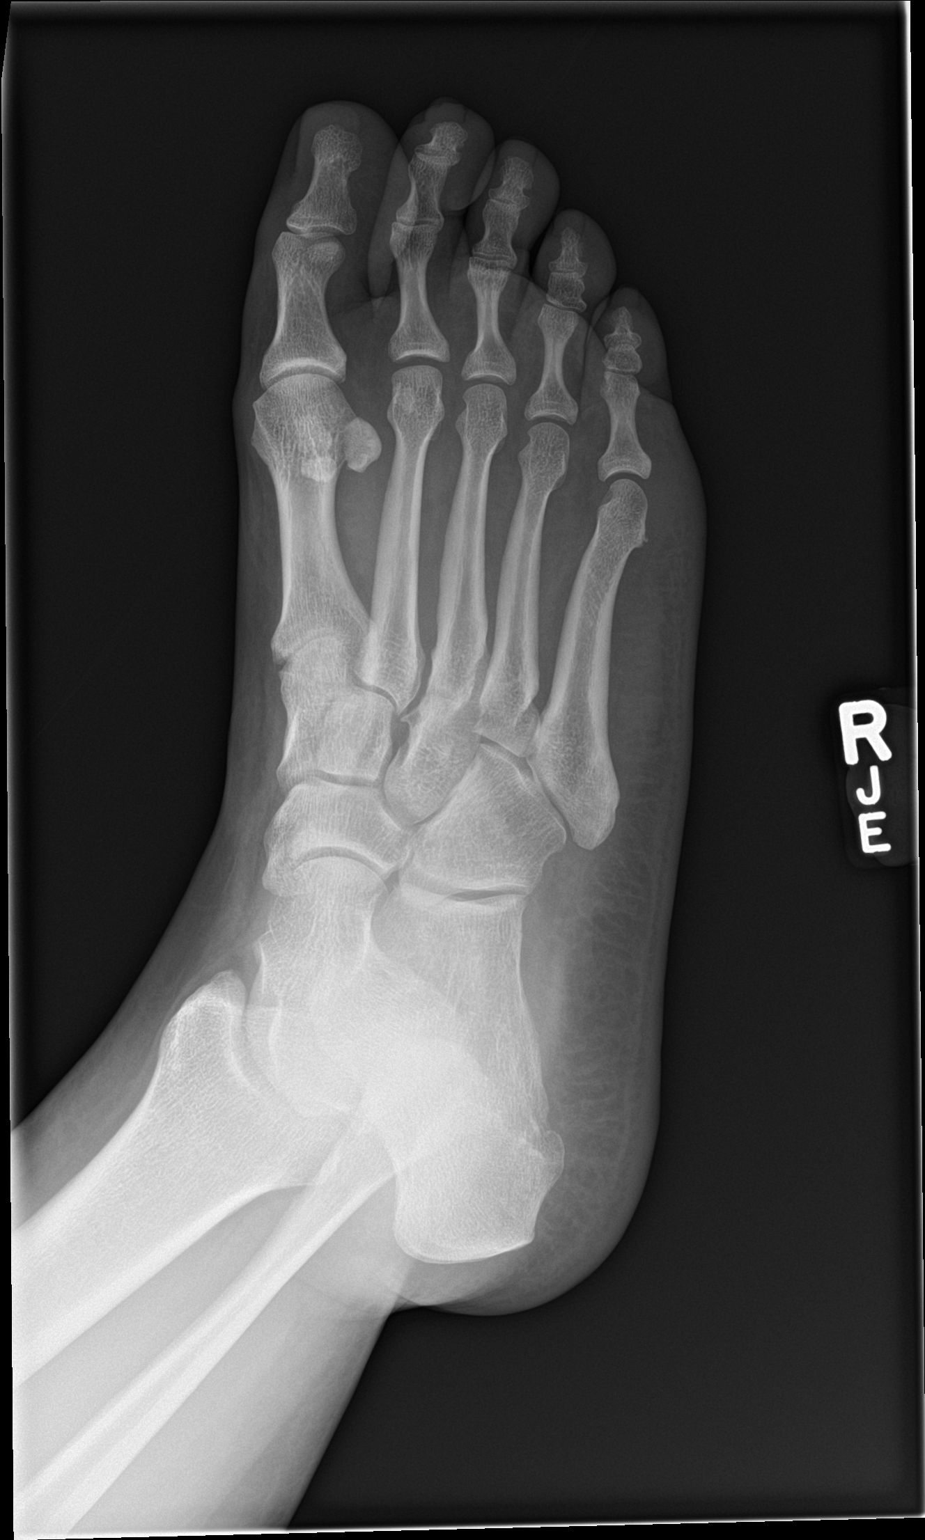

[foot lat]
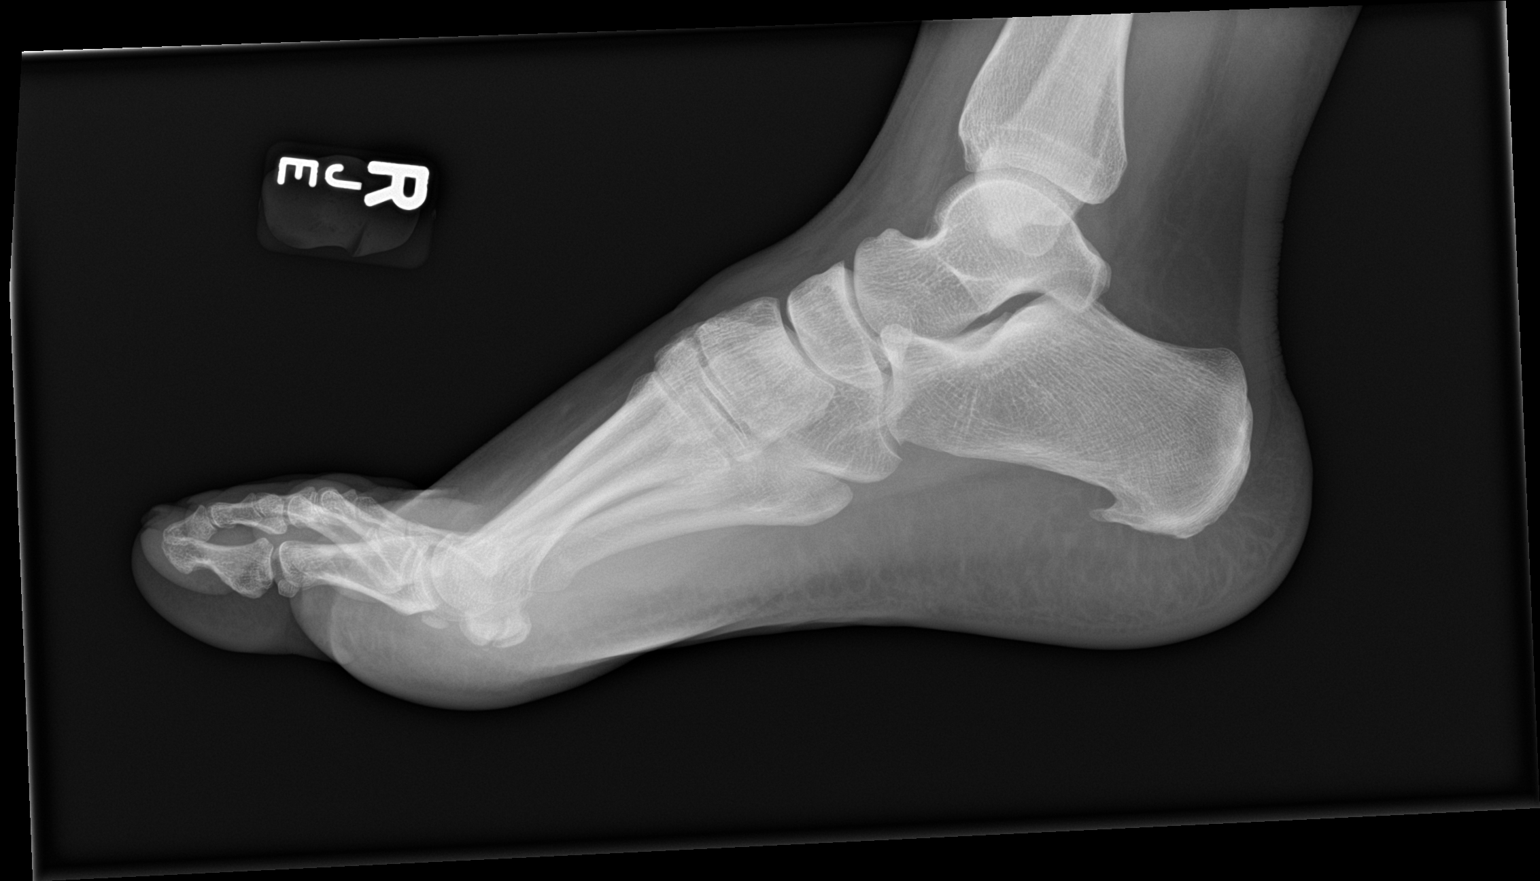

[3 of 3 positions shown; findings below may reference images not displayed]

FINDINGS: Right ankle: Frontal, oblique, lateral views of the right ankle are
obtained. No fracture, subluxation, or dislocation. Joint spaces are
well preserved. Soft tissues are normal.

Right foot: Frontal, oblique, and lateral views demonstrate no acute
fracture, subluxation, or dislocation. Joint spaces are well
preserved. There is a large inferior calcaneal spur. Soft tissues
are unremarkable.
IMPRESSION: 1. No acute fracture within the right foot or ankle.
2. Large inferior calcaneal spur.

## 2022-08-30 ENCOUNTER — Encounter: Payer: Self-pay | Admitting: *Deleted

## 2024-03-21 ENCOUNTER — Emergency Department (HOSPITAL_BASED_OUTPATIENT_CLINIC_OR_DEPARTMENT_OTHER)
Admission: EM | Admit: 2024-03-21 | Discharge: 2024-03-21 | Disposition: A | Discharge status: Home / Self Care | Payer: Self-pay | Attending: Emergency Medicine | Admitting: Emergency Medicine

## 2024-03-21 ENCOUNTER — Emergency Department (HOSPITAL_BASED_OUTPATIENT_CLINIC_OR_DEPARTMENT_OTHER): Payer: Self-pay

## 2024-03-21 ENCOUNTER — Other Ambulatory Visit: Payer: Self-pay

## 2024-03-21 ENCOUNTER — Encounter (HOSPITAL_BASED_OUTPATIENT_CLINIC_OR_DEPARTMENT_OTHER): Payer: Self-pay

## 2024-03-21 DIAGNOSIS — R531 Weakness: Secondary | ICD-10-CM | POA: Insufficient documentation

## 2024-03-21 DIAGNOSIS — M25532 Pain in left wrist: Secondary | ICD-10-CM | POA: Insufficient documentation

## 2024-03-21 MED ORDER — LIDOCAINE 5 % EX PTCH: 1.0000 | MEDICATED_PATCH | CUTANEOUS | 0 refills | Status: AC

## 2024-03-21 MED ORDER — NAPROXEN 500 MG PO TABS: 500.0000 mg | ORAL_TABLET | Freq: Two times a day (BID) | ORAL | 0 refills | Status: AC

## 2024-03-21 MED ORDER — METHOCARBAMOL 500 MG PO TABS: 500.0000 mg | ORAL_TABLET | Freq: Two times a day (BID) | ORAL | 0 refills | Status: AC
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
## 2024-03-21 NOTE — Discharge Instructions (Addendum)
 It was a pleasure taking care of you here today  We have given you a different brace.  As we discussed you need to follow-up with hand surgery.  I have written you for some anti-inflammatories as well as muscle relaxers.  Do not take any additional nonsteroidal anti-inflammatory such as ibuprofen, Aleve while taking this medication  Follow-up outpatient, return for any worsening symptoms
